# Patient Record
Sex: Female | Born: 1965 | Race: Black or African American | Hispanic: No | Marital: Single
Health system: Midwestern US, Academic
[De-identification: ages and names within clinical notes are randomized; demographics above are authoritative.]

---

## 2020-12-19 ENCOUNTER — Encounter: Admit: 2020-12-19 | Discharge: 2020-12-19 | Payer: Commercial Managed Care - PPO

## 2022-05-19 ENCOUNTER — Encounter: Admit: 2022-05-19 | Discharge: 2022-05-19 | Payer: Commercial Managed Care - PPO

## 2022-05-24 ENCOUNTER — Encounter: Admit: 2022-05-24 | Discharge: 2022-05-24 | Payer: Commercial Managed Care - PPO

## 2022-05-27 ENCOUNTER — Encounter: Admit: 2022-05-27 | Discharge: 2022-05-27 | Payer: Commercial Managed Care - PPO

## 2022-05-27 NOTE — Telephone Encounter
Patient called the scheduling team last night and requested to schedule an appointment. RN calling patient back to discuss referral, insurance, and scheduling. No answer. Message left for patient to return my call. Robyne Askew, RN    No future appointments.

## 2022-06-02 ENCOUNTER — Encounter: Admit: 2022-06-02 | Discharge: 2022-06-02

## 2022-06-02 DIAGNOSIS — N912 Amenorrhea, unspecified: Secondary | ICD-10-CM

## 2022-06-02 NOTE — Telephone Encounter
PC with the patient, she voiced frustrations that she was not able to be scheduled with Dr. Truman Hayward in [facility].  This nurse explained that all of the records we have received indicate that she is not pregnant.  [facility] is for pregnant patients only.  From the records it looks like she needs to see a GYN specialist.Patient became very angry and calling me/nurses incompetent.  This nurse explained that we will not tolerate her speaking to the staff in this manner.     Patient argued that she had other records that indicated she was pregnant.  She was instructed to e-mail those to [place].  After review of those emails, this nurse explained to the patient that there is still no indication that she is pregnant and we are happy to get her scheduled with a GYN specialist. Patient either hung up or we lost connection. This nurse will send the patient's info to the Nurse Manager for general OBGYN.

## 2022-06-02 NOTE — Telephone Encounter
Called patient to offer her an appointment with our OB/GYN team. No answer - left voicemail stating I was calling from [facility] regarding a referral. Requested she return call - appt currently available 11/14 with Dr. Josefa Half.

## 2022-06-03 ENCOUNTER — Encounter: Admit: 2022-06-03 | Discharge: 2022-06-03

## 2022-06-03 NOTE — Telephone Encounter
Ananya left voicemail today on [facility] RN line. Voicemail stated pt's name and that she wanted to ensure we received lab work that she faxed over. Junice reports she was told to email it to [email or web address] email address. She reports someone called her earlier form [employer] but she doesn't know what number she reports being "very confused and very tired" by all of the communications.    She states she just needs to see a doctor as advised by Mikeal Hawthorne rogers who referred her to OB/GYN. Kaoru states she does not want to be stuck again and "doesn't feel comfortable anymore and I'm not getting sticked no more."    Pt did email photographs to [email or web address] email address. These are photographs of a photograph of an IUD on what appears to be a flat blue background, a photograph of a printed ultrasound picture with no visible words, date, or patient identifiers on it, two letters (one with patients name) stating "congratulations on your pregnancy" with no dates or additional information, and the comments section of a lab report.    I called patient back to attempt to offer appointment with Dr. Josefa Half on 11/14.  No answer.    Shortly after this I was made aware by Gyn care coordinators that patient left htem voicemail. In voicemail pt expresses frustration and states she feels "mistreated and harmed." She requests we call back with a list of physicians she could see. She will research the list and notify us who she'd like to see.    Second attempt to return patient's call at [phone] - no answer. Left voicemail with my direct call back number.
